# Patient Record
Sex: Female | Born: 1955 | Hispanic: No | Marital: Married
Health system: Southern US, Community
[De-identification: ages and names within clinical notes are randomized; demographics above are authoritative.]

---

## 2015-06-19 ENCOUNTER — Ambulatory Visit
Admission: RE | Admit: 2015-06-19 | Discharge: 2015-06-19 | Disposition: A | Discharge status: Home / Self Care | Payer: PRIVATE HEALTH INSURANCE | Source: Ambulatory Visit | Attending: Infectious Disease | Admitting: Infectious Disease

## 2015-06-19 ENCOUNTER — Other Ambulatory Visit: Payer: Self-pay | Admitting: Infectious Disease

## 2015-06-19 DIAGNOSIS — Z111 Encounter for screening for respiratory tuberculosis: Secondary | ICD-10-CM

## 2015-07-16 ENCOUNTER — Encounter (HOSPITAL_COMMUNITY): Payer: Self-pay | Admitting: *Deleted

## 2015-07-16 ENCOUNTER — Emergency Department (INDEPENDENT_AMBULATORY_CARE_PROVIDER_SITE_OTHER): Payer: Medicaid Other

## 2015-07-16 ENCOUNTER — Emergency Department (INDEPENDENT_AMBULATORY_CARE_PROVIDER_SITE_OTHER)
Admission: EM | Admit: 2015-07-16 | Discharge: 2015-07-16 | Disposition: A | Discharge status: Home / Self Care | Payer: Medicaid Other | Source: Home / Self Care | Attending: Family Medicine | Admitting: Family Medicine

## 2015-07-16 DIAGNOSIS — S339XXA Sprain of unspecified parts of lumbar spine and pelvis, initial encounter: Secondary | ICD-10-CM

## 2015-07-16 DIAGNOSIS — S335XXA Sprain of ligaments of lumbar spine, initial encounter: Secondary | ICD-10-CM

## 2015-07-16 MED ORDER — MELOXICAM 7.5 MG PO TABS: 7.5000 mg | ORAL_TABLET | Freq: Two times a day (BID) | ORAL | Status: DC

## 2015-07-16 MED ORDER — METHOCARBAMOL 500 MG PO TABS: 500.0000 mg | ORAL_TABLET | Freq: Two times a day (BID) | ORAL | Status: AC

## 2015-07-16 NOTE — ED Notes (Signed)
Pt  Reports  Low  Back  Pain      Pt  Reports   She has  Had   Pain  Radiating  Down  r   Leg           She   Has  Had  The  Pain      Prior   To  Arriving  [place]  [place]ths  Ago         She  Has  Sample  Packs  Of trammadol  With  Her     She ambulated  To  Room        She  Denied  Any  Recent  specefic  Injury             [employer]  Utilized    For  Translation

## 2015-07-16 NOTE — ED Provider Notes (Signed)
CSN: [id number]     Arrival date & time 07/16/15  1300 History   First MD Initiated Contact with Patient 07/16/15 1310     Chief Complaint  Patient presents with  . Back Pain   (Consider location/radiation/quality/duration/timing/severity/associated sxs/prior Treatment) Patient is a 59 y.o. female presenting with back pain. The history is provided by the patient and the spouse. The history is limited by a language barrier. A language interpreter was used.  Back Pain Location:  Lumbar spine Quality:  Shooting Radiates to:  R posterior upper leg Pain severity:  Moderate Onset quality:  Gradual Duration:  1 month Timing:  Constant Progression:  Unchanged Chronicity:  New Ineffective treatments:  NSAIDs (seen in [place] and given med which hasn't helped.) Associated symptoms: leg pain   Associated symptoms: no abdominal pain, no dysuria, no fever, no numbness and no pelvic pain     History reviewed. No pertinent past medical history. History reviewed. No pertinent past surgical history. History reviewed. No pertinent family history. History  Substance Use Topics  . Smoking status: Not on file  . Smokeless tobacco: Not on file  . Alcohol Use: No   OB History    No data available     Review of Systems  Constitutional: Negative.  Negative for fever.  Gastrointestinal: Negative.  Negative for abdominal pain.  Genitourinary: Negative for dysuria, hematuria and pelvic pain.  Musculoskeletal: Positive for back pain. Negative for gait problem.  Skin: Negative.   Neurological: Negative for numbness.    Allergies  Review of patient's allergies indicates no known allergies.  Home Medications   Prior to Admission medications   Medication Sig Start Date End Date Taking? Authorizing Provider  TRAMADOL HCL ER PO Take by mouth.   Yes Historical Provider, MD  meloxicam (MOBIC) 7.5 MG tablet Take 1 tablet (7.5 mg total) by mouth 2 (two) times daily after a meal. 07/16/15   Linna Hoff,  MD  methocarbamol (ROBAXIN) 500 MG tablet Take 1 tablet (500 mg total) by mouth 2 (two) times daily. As muscle relaxer 07/16/15   Linna Hoff, MD   BP 137/84 mmHg  Pulse 76  Temp(Src) 98.1 F (36.7 C) (Oral)  Resp 12  SpO2 96%  LMP  (Exact Date) Physical Exam  Constitutional: She is oriented to person, place, and time. She appears well-developed and well-nourished. No distress.  Abdominal: Soft. Bowel sounds are normal.  Musculoskeletal: She exhibits tenderness.       Lumbar back: She exhibits tenderness, pain and spasm. She exhibits normal range of motion, no bony tenderness, no swelling and normal pulse.       Back:  Neurological: She is alert and oriented to person, place, and time.  Skin: Skin is warm and dry.  Nursing note and vitals reviewed.   ED Course  Procedures (including critical care time) Labs Review Labs Reviewed - No data to display  Imaging Review Dg Lumbar Spine Complete  07/16/2015   CLINICAL DATA:  Right lumbar pain  EXAM: LUMBAR SPINE - COMPLETE 4+ VIEW  COMPARISON:  None.  FINDINGS: There is no evidence of lumbar spine fracture. Alignment is normal. Intervertebral disc spaces are maintained.  IMPRESSION: Negative.   Electronically Signed   By: Marlan Palau M.D.   On: 07/16/2015 13:59    X-rays reviewed and report per radiologist.  MDM   1. Low back sprain, initial encounter        Linna Hoff, MD 07/16/15 [phone]

## 2015-08-25 ENCOUNTER — Ambulatory Visit: Payer: Self-pay | Admitting: Family Medicine

## 2015-09-01 ENCOUNTER — Ambulatory Visit: Payer: Self-pay | Admitting: Family Medicine

## 2015-09-11 ENCOUNTER — Ambulatory Visit: Payer: Self-pay | Admitting: Family Medicine

## 2015-09-25 ENCOUNTER — Ambulatory Visit: Payer: Self-pay | Admitting: Family Medicine

## 2015-09-28 DIAGNOSIS — Z7689 Persons encountering health services in other specified circumstances: Secondary | ICD-10-CM

## 2015-09-28 NOTE — Congregational Nurse Program (Unsigned)
09-23-15 Office visit to reschedule referral to [facility] PCP. B/p 128/79. Pulse 75. Unable to check B/S today per client's request.  Appointment at [facility] on September 29, 2015 at 3:00 pm. [address] [place], [place]  [phone]) Requires interpreter; speaks Arabic.

## 2015-09-29 ENCOUNTER — Ambulatory Visit (INDEPENDENT_AMBULATORY_CARE_PROVIDER_SITE_OTHER): Payer: Medicaid Other | Admitting: Family Medicine

## 2015-09-29 ENCOUNTER — Encounter: Payer: Self-pay | Admitting: Family Medicine

## 2015-09-29 VITALS — BP 137/70 | HR 79 | Temp 97.7°F | Resp 16 | Ht 60.0 in | Wt 161.0 lb

## 2015-09-29 DIAGNOSIS — Z0000 Encounter for general adult medical examination without abnormal findings: Secondary | ICD-10-CM

## 2015-09-29 DIAGNOSIS — M542 Cervicalgia: Secondary | ICD-10-CM | POA: Diagnosis not present

## 2015-09-29 DIAGNOSIS — Z7689 Persons encountering health services in other specified circumstances: Secondary | ICD-10-CM

## 2015-09-29 DIAGNOSIS — Z7189 Other specified counseling: Secondary | ICD-10-CM

## 2015-09-29 MED ORDER — MELOXICAM 7.5 MG PO TABS: 7.5000 mg | ORAL_TABLET | Freq: Every day | ORAL | Status: AC

## 2015-09-29 NOTE — Progress Notes (Signed)
Patient ID: Kaitlyn Burton, female   DOB: 07/10/1956, 59 y.o.   MRN: [id number]   Kaitlyn Burton, is a 59 y.o. female  [id number]  [id number]  DOB - 03/02/1956  CC:  Chief Complaint  Patient presents with  . Establish Care    Patient states pressure all over body. Sore muscle in back of neck         HPI: Kaitlyn Burton is a 59 y.o. female here to establish care. She denies chronic illness. Her only recent medications have been for neck and back pain. Meloxicam, Robaxin and Tramadol were prescribed in late August for same. She reports having a PAP and mammogram through the [facility]. She brings her immunization records and everything is up to date. She reports having a sense of increased pressure in her body.  I am not sure what that means. Her only other complaint is of the ongoing neck pain with radiation into the back. This is aggravated by work activities and relieved with rest.   No Known Allergies No past medical history on file. Current Outpatient Prescriptions on File Prior to Visit  Medication Sig Dispense Refill  . methocarbamol (ROBAXIN) 500 MG tablet Take 1 tablet (500 mg total) by mouth 2 (two) times daily. As muscle relaxer (Patient not taking: Reported on 09/29/2015) 30 tablet 0  . TRAMADOL HCL ER PO Take by mouth.     No current facility-administered medications on file prior to visit.   No family history on file. Social History   Social History  . Marital Status: Married    Spouse Name: N/A  . Number of Children: N/A  . Years of Education: N/A   Occupational History  . Not on file.   Social History Main Topics  . Smoking status: Not on file  . Smokeless tobacco: Not on file  . Alcohol Use: No  . Drug Use: Not on file  . Sexual Activity: Not on file   Other Topics Concern  . Not on file   Social History Narrative  . No narrative on file    Review of Systems: Constitutional: Negative for fever, chills, appetite change, weight loss,  Fatigue. Skin:  Negative for rashes or lesions of concern. HENT: Negative for ear pain, ear discharge.nose bleeds Eyes: Negative for pain, discharge, redness, itching and visual disturbance. Needs glasses Neck: Negative for pain, stiffness Respiratory: Negative for cough, shortness of breath,   Cardiovascular: Negative for chest pain, palpitations and leg swelling. Gastrointestinal: Negative for abdominal pain, nausea, vomiting, diarrhea, constipations Genitourinary: Negative for dysuria, urgency, frequency, hematuria,  Musculoskeletal: Negative for joint pain, joint  swelling, and gait problem.Negative for weakness. Positive for neck and back pain. Occassional right arm pain. Neurological: Negative for dizziness, tremors, seizures, syncope,   light-headedness, numbness and headaches.  Hematological: Negative for easy bruising or bleeding Psychiatric/Behavioral: Negative for depression, anxiety, decreased concentration, confusion   Objective:   Filed Vitals:   09/29/15 1455  BP: 137/70  Pulse: 79  Temp: 97.7 F (36.5 C)  Resp: 16    Physical Exam: Constitutional: Patient appears well-developed and well-nourished. No distress. HENT: Normocephalic, atraumatic, External right and left ear normal. Oropharynx is clear and moist.  Eyes: Conjunctivae and EOM are normal. PERRLA, no scleral icterus. Neck: Normal ROM. Neck supple. No lymphadenopathy, No thyromegaly. CVS: RRR, S1/S2 +, no murmurs, no gallops, no rubs Pulmonary: Effort and breath sounds normal, no stridor, rhonchi, wheezes, rales.  Abdominal: Soft. Normoactive BS,, no distension, tenderness, rebound or guarding.  Musculoskeletal: Normal  range of motion. No edema. There is mild tenderness over the left lateral neck.  Neuro: Alert.Normal muscle tone coordination. Non-focal Skin: Skin is warm and dry. No rash noted. Not diaphoretic. No erythema. No pallor. Psychiatric: Normal mood and affect. Behavior, judgment, thought content normal.  No  results found for: WBC, HGB, HCT, MCV, PLT No results found for: CREATININE, BUN, NA, K, CL, CO2  No results found for: HGBA1C Lipid Panel  No results found for: CHOL, TRIG, HDL, CHOLHDL, VLDL, LDLCALC     Assessment and plan:   1. Health care maintenance  - COMPLETE METABOLIC PANEL WITH GFR - CBC with Differential - Lipid panel - TSH - Vitamin D 1,25 dihydroxy - HIV antibody (with reflex)  2. Neck and upper back pain -meloxicam 7.5 mg, one po q day with food -Advised good sleeping position, heat and ice  3. Visit to Establish Care -I have reviewed information presented by the patient with the help of an interpreter and have reviewed immunization records from the health department.  Return in about 6 months (around 03/29/2016).  The patient was given clear instructions to go to ER or return to medical center if symptoms don't improve, worsen or new problems develop. The patient verbalized understanding.    Kaitlyn HooverLinda C Burton Yearick FNP  09/29/2015, 4:06 PM

## 2015-09-29 NOTE — Patient Instructions (Addendum)
I am represcribing a medication for your neck pain (meloxicam). Take in once a day with food. Be careful to keep your neck straight at night when you sleep.  We need to schedule you for a colonoscopy to screen for colon cancer in the near future.someone will will call you about this.  We will let you know if there is anything in your bloodwork that needs attention.

## 2015-09-30 LAB — LIPID PANEL
Cholesterol: 200 mg/dL (ref 125–200)
HDL: 40 mg/dL — AB (ref >=46)
LDL Cholesterol: 118 mg/dL (ref <130)
Total CHOL/HDL Ratio: 5 Ratio (ref <=5.0)
Triglycerides: 212 mg/dL — ABNORMAL HIGH (ref <150)
VLDL: 42 mg/dL — AB (ref <30)

## 2015-09-30 LAB — CBC WITH DIFFERENTIAL/PLATELET
BASOS ABS: 0 10*3/uL (ref 0.0–0.1)
Basophils Relative: 0 % (ref 0–1)
Eosinophils Absolute: 0.2 10*3/uL (ref 0.0–0.7)
Eosinophils Relative: 2 % (ref 0–5)
HCT: 39.5 % (ref 36.0–46.0)
HEMOGLOBIN: 13.1 g/dL (ref 12.0–15.0)
LYMPHS ABS: 2.8 10*3/uL (ref 0.7–4.0)
Lymphocytes Relative: 36 % (ref 12–46)
MCH: 27.2 pg (ref 26.0–34.0)
MCHC: 33.2 g/dL (ref 30.0–36.0)
MCV: 82 fL (ref 78.0–100.0)
MONOS PCT: 5 % (ref 3–12)
MPV: 11 fL (ref 8.6–12.4)
Monocytes Absolute: 0.4 10*3/uL (ref 0.1–1.0)
NEUTROS PCT: 57 % (ref 43–77)
Neutro Abs: 4.4 10*3/uL (ref 1.7–7.7)
Platelets: 246 10*3/uL (ref 150–400)
RBC: 4.82 MIL/uL (ref 3.87–5.11)
RDW: 13.8 % (ref 11.5–15.5)
WBC: 7.8 10*3/uL (ref 4.0–10.5)

## 2015-09-30 LAB — COMPLETE METABOLIC PANEL WITH GFR
ALT: 11 U/L (ref 6–29)
AST: 15 U/L (ref 10–35)
Albumin: 4.2 g/dL (ref 3.6–5.1)
Alkaline Phosphatase: 72 U/L (ref 33–130)
BUN: 17 mg/dL (ref 7–25)
CO2: 23 mmol/L (ref 20–31)
Calcium: 8.8 mg/dL (ref 8.6–10.4)
Chloride: 106 mmol/L (ref 98–110)
Creat: 0.65 mg/dL (ref 0.50–0.99)
GFR, Est African American: >89 mL/min (ref >=60)
GFR, Est Non African American: >89 mL/min (ref >=60)
GLUCOSE: 111 mg/dL — AB (ref 65–99)
POTASSIUM: 4.4 mmol/L (ref 3.5–5.3)
SODIUM: 138 mmol/L (ref 135–146)
Total Bilirubin: 0.4 mg/dL (ref 0.2–1.2)
Total Protein: 7.2 g/dL (ref 6.1–8.1)

## 2015-09-30 LAB — HIV ANTIBODY (ROUTINE TESTING W REFLEX): HIV: NONREACTIVE

## 2015-09-30 LAB — TSH: TSH: 0.999 u[IU]/mL (ref 0.350–4.500)

## 2015-10-07 LAB — VITAMIN D 1,25 DIHYDROXY

## 2015-10-09 ENCOUNTER — Telehealth: Payer: Self-pay

## 2015-10-09 ENCOUNTER — Other Ambulatory Visit: Payer: Self-pay | Admitting: Family Medicine

## 2015-10-09 MED ORDER — SIMVASTATIN 20 MG PO TABS: 20.0000 mg | ORAL_TABLET | Freq: Every day | ORAL | Status: DC

## 2015-10-09 NOTE — Telephone Encounter (Signed)
-----   Message from Henrietta HooverLinda C Bernhardt, NP sent at 10/09/2015  7:50 AM EDT ----- HIV non reactive. TSH normal.Glucose 111 but not fasting.Triclycerides sl elevated but not fasting. Bad cholesterol and little elevated. Recommend medication. Will send in.

## 2015-10-09 NOTE — Telephone Encounter (Signed)
Called using [employer], Interpreter ID # [id number]. Called, Patient not available at this time, left message with son for patient to return call. Thanks!

## 2015-10-13 NOTE — Telephone Encounter (Signed)
Letter has been mailed to patient to contact us regarding labs. Thanks!

## 2015-10-21 ENCOUNTER — Telehealth: Payer: Self-pay | Admitting: *Deleted

## 2015-10-21 NOTE — Telephone Encounter (Signed)
Medical Assistant spoke with interpreter for patient. Patient was informed of her HIV being negative, patients Glucose was 111 non fasting. Patients Triglycerides were elevated.  Patient informed of Simvastatin being ordered and [facility] to the [facility] on the [address]. Patient had no further questions.

## 2016-02-28 LAB — POCT LIPID PANEL
HDL: 44
LDL: 111
TC: 194
TRG: 194

## 2016-02-28 LAB — GLUCOSE, POCT (MANUAL RESULT ENTRY): POC Glucose: 92 mg/dl (ref 70–99)

## 2016-03-29 ENCOUNTER — Ambulatory Visit: Payer: Medicaid Other | Admitting: Family Medicine

## 2016-05-03 ENCOUNTER — Ambulatory Visit: Payer: Medicaid Other | Attending: Internal Medicine

## 2016-06-22 ENCOUNTER — Ambulatory Visit (HOSPITAL_COMMUNITY)
Admission: EM | Admit: 2016-06-22 | Discharge: 2016-06-22 | Disposition: A | Discharge status: Home / Self Care | Payer: Medicaid Other | Attending: Family Medicine | Admitting: Family Medicine

## 2016-06-22 ENCOUNTER — Encounter (HOSPITAL_COMMUNITY): Payer: Self-pay | Admitting: Emergency Medicine

## 2016-06-22 ENCOUNTER — Ambulatory Visit (INDEPENDENT_AMBULATORY_CARE_PROVIDER_SITE_OTHER): Payer: Self-pay

## 2016-06-22 DIAGNOSIS — M25512 Pain in left shoulder: Secondary | ICD-10-CM

## 2016-06-22 DIAGNOSIS — R1012 Left upper quadrant pain: Secondary | ICD-10-CM

## 2016-06-22 DIAGNOSIS — R059 Cough, unspecified: Secondary | ICD-10-CM

## 2016-06-22 DIAGNOSIS — R05 Cough: Secondary | ICD-10-CM

## 2016-06-22 NOTE — Congregational Nurse Program (Signed)
Congregational Nurse Program Note  Date of Encounter: 06/22/2016  Past Medical History: No past medical history on file.  Encounter Details:     CNP Questionnaire - 06/22/16 1515    Patient Demographics   Is this a new or existing patient? Existing   Patient is considered a/an Refugee   Race Asian   Patient Assistance   Location of Patient Assistance Not Applicable   Patient's financial/insurance status Medicaid   Uninsured Patient Yes   Interventions Referred to ED/Urgent Care   Patient referred to apply for the following financial assistance Not Applicable   Food insecurities addressed Not Applicable   Transportation assistance No   Assistance securing medications No   Educational health offerings Navigating the healthcare system   Encounter Details   Primary purpose of visit Acute Illness/Condition Visit   Was an Emergency Department visit averted? Yes   Does patient have a medical provider? No   Patient referred to Urgent Care   Was a mental health screening completed? (GAINS tool) No   Does patient have dental issues? No   Does patient have vision issues? Yes   Was a vision referral made? No   Does your patient have an abnormal blood pressure today? No   Since previous encounter, have you referred patient for abnormal blood pressure that resulted in a new diagnosis or medication change? No   Does your patient have an abnormal blood glucose today? No   Since previous encounter, have you referred patient for abnormal blood glucose that resulted in a new diagnosis or medication change? No   Was there a life-saving intervention made? No     Office vijsit for this Arabic speaking lady accompanied by son who interpreted information. Reports falling on apartment steps about one week ago twisting left ankle and foot. Sustained some bruising that appears to be subsiding with bluish yellowish color of skin. Applied home remedy for soreness. Major concern from son is recent non  productive cough, pain in left shoulder and chest area extending into upper back. Denies history of TB or pneumonia. Related pain to previous joint and arthritic type pain.  Perspiring with cool,  pale clammy skin today. Blood pressure lower than usual.  [facility] with son and spouse. Return to office for assistance in locating PCP appointment. Ferol LuzMarietta Deb Loudin, RN/CN

## 2016-06-22 NOTE — Discharge Instructions (Signed)
°íÙåÑ áÏíß ßÑÇí íÓíæä Úáì ÇáÌÇäÈ ÇáÃíÓÑ ãä ÇáÑÆÉ. åÐÇ åæ áíÓíæä ÇáÊí ÓæÝ ÊÍÊÇÌ Åáì ãÒíÏ ãä ÊÞííãåÇ ãÚ ÇÎÊÈÇÑ ÎÇÕ ÏÚÇ ßÊ ÇáãÓÍ ÇáÖæÆí. °ÓæÝ ÊÍÊÇÌ Åáì ÑÄíÉ ØÈíÈ ÇÓãå. ÇáÏßÊæÑ. ãßíÇÛ æÇäå ÓæÝ íÎÕÕ áß ááÍÕæá Úáì åÐÇ ÇáÇÎÊÈÇÑ. °  yuzhir ladayk kuray yasyun ealaa aljanib al'aysar min alriyati. hadha hu lisiuwn alty sawf tahtaj 'iilaa mazid min taqyimiha mae aikhtibar khasin daea kt [place] aldawyiy. sawf tahtaj 'iilaa ruyat tabib aismih. alduktur. makyagh wa'anah sawf yukhasas lak lilhusul ealaa hadha alaikhtibar.

## 2016-06-22 NOTE — ED Provider Notes (Signed)
CSN: [id number]     Arrival date & time 06/22/16  1136 History   First MD Initiated Contact with Patient 06/22/16 1202     Chief Complaint  Patient presents with  . Shoulder Pain  . Cough   (Consider location/radiation/quality/duration/timing/severity/associated sxs/prior Treatment) HPI History obtained from husband through phone interpreter (Arabic)  Pt presents with the cc of:  Left shoulder pain Duration of symptoms: 7 months Treatment prior to arrival: Tylenol Context: She has had off-and-on pain in the left shoulder for about several months now. Has never had it evaluated. Pain is worsened over the past couple of days. No known injury. Other symptoms include: Coughing Pain score: 3 FAMILY HISTORY: None  Patient does have a lot of secondhand smoke exposure. She does not smoke.  History reviewed. No pertinent past medical history. History reviewed. No pertinent past surgical history. History reviewed. No pertinent family history. Social History  Substance Use Topics  . Smoking status: Never Smoker   . Smokeless tobacco: None  . Alcohol Use: No   OB History    No data available     Review of Systems  Denies: HEADACHE, NAUSEA, ABDOMINAL PAIN, CHEST PAIN, CONGESTION, DYSURIA, SHORTNESS OF BREATH  Allergies  Review of patient's allergies indicates no known allergies.  Home Medications   Prior to Admission medications   Medication Sig Start Date End Date Taking? Authorizing Provider  meloxicam (MOBIC) 7.5 MG tablet Take 1 tablet (7.5 mg total) by mouth daily. 09/29/15   Henrietta HooverLinda C Bernhardt, NP  methocarbamol (ROBAXIN) 500 MG tablet Take 1 tablet (500 mg total) by mouth 2 (two) times daily. As muscle relaxer Patient not taking: Reported on 09/29/2015 07/16/15   Linna HoffJames D Kindl, MD  simvastatin (ZOCOR) 20 MG tablet Take 1 tablet (20 mg total) by mouth at bedtime. 10/09/15   Henrietta HooverLinda C Bernhardt, NP  TRAMADOL HCL ER PO Take by mouth.    Historical Provider, MD   Meds Ordered and  Administered this Visit  Medications - No data to display  BP 150/82 mmHg  Pulse 79  Temp(Src) 98.1 F (36.7 C) (Oral)  Resp 20  SpO2 96%  LMP  (Exact Date) No data found.   Physical Exam NURSES NOTES AND VITAL SIGNS REVIEWED. CONSTITUTIONAL: Well developed, well nourished, no acute distress HEENT: normocephalic, atraumatic EYES: Conjunctiva normal NECK:normal ROM, supple, no adenopathy PULMONARY:No respiratory distress, normal effort ABDOMINAL: Soft, ND, NT BS+, No CVAT MUSCULOSKELETAL: Normal ROM of all extremities,  SKIN: warm and dry without rash PSYCHIATRIC: Mood and affect, behavior are normal  ED Course  Procedures (including critical care time)  Labs Review Labs Reviewed - No data to display  Imaging Review Dg Chest 2 View  06/22/2016  CLINICAL DATA:  60 year old female with a history of left shoulder pain EXAM: CHEST  2 VIEW COMPARISON:  None. FINDINGS: Cardiomediastinal silhouette within normal limits. Soft tissue density projecting superior to the aortic arch, along the medial pleural parenchymal interface at the mediastinal border. Pleural parenchymal thickening at the apex of the left lung. No confluent airspace disease.  No pneumothorax. Stigmata of emphysema, with increased retrosternal airspace, flattened hemidiaphragms, increased AP diameter, and hyperinflation on the AP view. IMPRESSION: Soft tissue opacity along the medial left upper lung, likely posterior on the lateral view. Further evaluation with contrast-enhanced chest CT recommended to evaluate for malignancy. These results will be called to the ordering clinician or representative by the Radiologist Assistant, and communication documented in the PACS or zVision Dashboard. Signed, Yvone NeuJaime S. Loreta AveWagner,  DO [facility] Radiology Electronically Signed   By: Gilmer Mor D.O.   On: 06/22/2016 13:01    Discussed with patient and on duty FP resident who has agreed to  have pt follow up at the [facility].  Visual Acuity Review  Right Eye Distance:   Left Eye Distance:   Bilateral Distance:    Right Eye Near:   Left Eye Near:    Bilateral Near:         MDM   1. Left shoulder pain     Patient is reassured that there are no issues that require transfer to higher level of care at this time or additional tests. Patient is advised to continue home symptomatic treatment. Patient is advised that if there are new or worsening symptoms to attend the emergency department, contact primary care provider, or return to UC. Instructions of care provided discharged home in stable condition.    THIS NOTE WAS GENERATED USING A VOICE RECOGNITION SOFTWARE PROGRAM. ALL REASONABLE EFFORTS  WERE MADE TO PROOFREAD THIS DOCUMENT FOR ACCURACY.  I have verbally reviewed the discharge instructions with the patient. A printed AVS was given to the patient.  All questions were answered prior to discharge.      Tharon Aquas, PA 06/22/16 1414

## 2016-06-22 NOTE — ED Notes (Signed)
[facility] with a complaint of left shoulder pain that radiates down her back x 7 years. The patient stated that she has a "mass" on her back where the pain originates.

## 2016-07-12 ENCOUNTER — Ambulatory Visit: Payer: Medicaid Other | Attending: Internal Medicine

## 2016-07-23 ENCOUNTER — Ambulatory Visit (INDEPENDENT_AMBULATORY_CARE_PROVIDER_SITE_OTHER): Payer: No Typology Code available for payment source | Admitting: Family Medicine

## 2016-07-23 ENCOUNTER — Encounter: Payer: Self-pay | Admitting: Family Medicine

## 2016-07-23 VITALS — BP 129/75 | HR 78 | Temp 98.3°F | Resp 16 | Ht 60.0 in | Wt 158.0 lb

## 2016-07-23 DIAGNOSIS — Z1239 Encounter for other screening for malignant neoplasm of breast: Secondary | ICD-10-CM

## 2016-07-23 DIAGNOSIS — I10 Essential (primary) hypertension: Secondary | ICD-10-CM

## 2016-07-23 DIAGNOSIS — Z1159 Encounter for screening for other viral diseases: Secondary | ICD-10-CM

## 2016-07-23 DIAGNOSIS — M25512 Pain in left shoulder: Secondary | ICD-10-CM

## 2016-07-23 DIAGNOSIS — E7800 Pure hypercholesterolemia, unspecified: Secondary | ICD-10-CM | POA: Insufficient documentation

## 2016-07-23 DIAGNOSIS — L989 Disorder of the skin and subcutaneous tissue, unspecified: Secondary | ICD-10-CM

## 2016-07-23 LAB — CBC WITH DIFFERENTIAL/PLATELET
BASOS PCT: 0 %
Basophils Absolute: 0 cells/uL (ref 0–200)
Eosinophils Absolute: 136 cells/uL (ref 15–500)
Eosinophils Relative: 2 %
HEMATOCRIT: 39.6 % (ref 35.0–45.0)
Hemoglobin: 13.1 g/dL (ref 11.7–15.5)
LYMPHS ABS: 2584 {cells}/uL (ref 850–3900)
LYMPHS PCT: 38 %
MCH: 26.8 pg — ABNORMAL LOW (ref 27.0–33.0)
MCHC: 33.1 g/dL (ref 32.0–36.0)
MCV: 81.1 fL (ref 80.0–100.0)
MONO ABS: 408 {cells}/uL (ref 200–950)
MPV: 10.9 fL (ref 7.5–12.5)
Monocytes Relative: 6 %
Neutro Abs: 3672 cells/uL (ref 1500–7800)
Neutrophils Relative %: 54 %
PLATELETS: 276 10*3/uL (ref 140–400)
RBC: 4.88 MIL/uL (ref 3.80–5.10)
RDW: 14 % (ref 11.0–15.0)
WBC: 6.8 10*3/uL (ref 3.8–10.8)

## 2016-07-23 LAB — COMPLETE METABOLIC PANEL WITH GFR
ALT: 13 U/L (ref 6–29)
AST: 12 U/L (ref 10–35)
Albumin: 4.1 g/dL (ref 3.6–5.1)
Alkaline Phosphatase: 85 U/L (ref 33–130)
BUN: 13 mg/dL (ref 7–25)
CHLORIDE: 107 mmol/L (ref 98–110)
CO2: 24 mmol/L (ref 20–31)
CREATININE: 0.54 mg/dL (ref 0.50–0.99)
Calcium: 9.3 mg/dL (ref 8.6–10.4)
GFR, Est African American: >89 mL/min (ref >=60)
GFR, Est Non African American: >89 mL/min (ref >=60)
Glucose, Bld: 107 mg/dL — ABNORMAL HIGH (ref 65–99)
Potassium: 4.2 mmol/L (ref 3.5–5.3)
SODIUM: 139 mmol/L (ref 135–146)
Total Bilirubin: 0.4 mg/dL (ref 0.2–1.2)
Total Protein: 7 g/dL (ref 6.1–8.1)

## 2016-07-23 LAB — LIPID PANEL
CHOLESTEROL: 189 mg/dL (ref 125–200)
HDL: 49 mg/dL (ref >=46)
LDL CALC: 99 mg/dL (ref <130)
TRIGLYCERIDES: 203 mg/dL — AB (ref <150)
Total CHOL/HDL Ratio: 3.9 Ratio (ref <=5.0)
VLDL: 41 mg/dL — ABNORMAL HIGH (ref <30)

## 2016-07-23 NOTE — Progress Notes (Signed)
Kaitlyn Burton, is a 60 y.o. female  ZYS:[id number]  [id number]  DOB - 06-15-1956  CC:  Chief Complaint  Patient presents with  . Shoulder Pain    left shoulder.        HPI: Kaitlyn Burton is a 60 y.o. female presents to follow-up on ED visit forleft shoulder pain. At that visit an x-ray chest x-ray showed a lesion and a contrasted enhanced CT has been ordered. She reports that her shoulder pain is unchanged since that visit.She is currently only using OTC analgesics for discomfort.   No Known Allergies History reviewed. No pertinent past medical history. Current Outpatient Prescriptions on File Prior to Visit  Medication Sig Dispense Refill  . meloxicam (MOBIC) 7.5 MG tablet Take 1 tablet (7.5 mg total) by mouth daily. (Patient not taking: Reported on 07/23/2016) 30 tablet 1  . methocarbamol (ROBAXIN) 500 MG tablet Take 1 tablet (500 mg total) by mouth 2 (two) times daily. As muscle relaxer (Patient not taking: Reported on 07/23/2016) 30 tablet 0  . simvastatin (ZOCOR) 20 MG tablet Take 1 tablet (20 mg total) by mouth at bedtime. (Patient not taking: Reported on 07/23/2016) 90 tablet 3  . TRAMADOL HCL ER PO Take by mouth.     No current facility-administered medications on file prior to visit.    History reviewed. No pertinent family history. Social History   Social History  . Marital status: Married    Spouse name: N/A  . Number of children: N/A  . Years of education: N/A   Occupational History  . Not on file.   Social History Main Topics  . Smoking status: Never Smoker  . Smokeless tobacco: Not on file  . Alcohol use No  . Drug use: No  . Sexual activity: Not on file   Other Topics Concern  . Not on file   Social History Narrative  . No narrative on file    Review of Systems: Constitutional: Negative for fever, chills, appetite change, weight loss,  Fatigue. Skin: Negative for rashes or lesions of concern. HENT: Negative for ear pain, ear discharge.nose  bleeds Eyes: Negative for pain, discharge, redness, itching and visual disturbance. Neck: Negative for pain, stiffness Respiratory: Negative for cough, shortness of breath,   Cardiovascular: Negative for chest pain, palpitations and leg swelling. Gastrointestinal: Negative for abdominal pain, nausea, vomiting, diarrhea, constipations Genitourinary: Negative for dysuria, urgency, frequency, hematuria,  Musculoskeletal: Negative for back pain, joint pain, joint  swelling, and gait problem.Negative for weakness. Neurological: Negative for dizziness, tremors, seizures, syncope,   light-headedness, numbness and headaches.  Hematological: Negative for easy bruising or bleeding Psychiatric/Behavioral: Negative for depression, anxiety, decreased concentration, confusion   Objective:   Vitals:   07/23/16 1025  BP: 129/75  Pulse: 78  Resp: 16  Temp: 98.3 F (36.8 C)    Physical Exam: Constitutional: Patient appears well-developed and well-nourished. No distress. HENT: Normocephalic, atraumatic, External right and left ear normal. Oropharynx is clear and moist.  Eyes: Conjunctivae and EOM are normal. PERRLA, no scleral icterus. Neck: Normal ROM. Neck supple. No lymphadenopathy, No thyromegaly. CVS: RRR, S1/S2 +, no murmurs, no gallops, no rubs Pulmonary: Effort and breath sounds normal, no stridor, rhonchi, wheezes, rales.  Abdominal: Soft. Normoactive BS,, no distension, tenderness, rebound or guarding.  Musculoskeletal: Normal range of motion. No edema and no tenderness.  Neuro: Alert.Normal muscle tone coordination. Non-focal Skin: Skin is warm and dry. No rash noted. Not diaphoretic. No erythema. No pallor. Psychiatric: Normal mood and affect. Behavior, judgment,  thought content normal.  Lab Results  Component Value Date   WBC 7.8 09/29/2015   HGB 13.1 09/29/2015   HCT 39.5 09/29/2015   MCV 82.0 09/29/2015   PLT 246 09/29/2015   Lab Results  Component Value Date   CREATININE  0.65 09/29/2015   BUN 17 09/29/2015   NA 138 09/29/2015   K 4.4 09/29/2015   CL 106 09/29/2015   CO2 23 09/29/2015    No results found for: HGBA1C Lipid Panel     Component Value Date/Time   CHOL 200 09/29/2015 1555   TRIG 212 (H) 09/29/2015 1555   HDL 40 (L) 09/29/2015 1555   CHOLHDL 5.0 09/29/2015 1555   VLDL 42 (H) 09/29/2015 1555   LDLCALC 118 09/29/2015 1555       Assessment and plan:   1. Pure hypercholesterolemia -continue current medication  2. Screening for breast cancer  - MM DIGITAL SCREENING BILATERAL; Future  3. Chest lesion  - CT Chest High Resolution; Future  4. Essential hypertension  - COMPLETE METABOLIC PANEL WITH GFR - CBC w/Diff - Lipid panel  5. Need for hepatitis C screening test  - Hepatitis C Antibody   No Follow-up on file.  The patient was given clear instructions to go to ER or return to medical center if symptoms don't improve, worsen or new problems develop. The patient verbalized understanding.    Henrietta HooverLinda C Kanon Novosel FNP  07/23/2016, 1:14 PM

## 2016-07-24 LAB — HEPATITIS C ANTIBODY: HCV Ab: NEGATIVE

## 2016-08-11 DIAGNOSIS — R1012 Left upper quadrant pain: Secondary | ICD-10-CM

## 2016-08-18 NOTE — Congregational Nurse Program (Signed)
Congregational Nurse Program Note  Date of Encounter: 08/11/2016  Past Medical History: No past medical history on file.  Encounter Details:      Amb Nursing Assessment - 07/23/16 1047      Pain Assessment   Pain Assessment No/denies pain     Nutrition Screen   Diabetes No     Functional Status   Activities of Daily Living Independent   Ambulation Independent   Medication Administration Independent   Home Management Independent     Abuse/Neglect Assessment   Do you feel unsafe in your current relationship? No   Do you feel physically threatened by others? No   Anyone hurting you at home, work, or school? No   Unable to ask? No     Patient Literacy   How often do you need to have someone help you when you read instructions, pamphlets, or other written materials from your doctor or pharmacy? 5 - Always     Language Assistant   Interpreter Needed? Yes   Sales execut[employer]enterpreter [employer]   Interpreter ID [id number]     Requested assistance in rescheduling appointment for CT Scan at [facility], [address], [place].  Previous appointment on 08/13/16 at 9:45am. Rescheduled CT Scan for 08/20/16  at 9:45 am.Transportation arranged. Ferol LuzMarietta Melanye Hiraldo, RN/[phone]

## 2016-09-01 DIAGNOSIS — R05 Cough: Secondary | ICD-10-CM

## 2016-09-01 DIAGNOSIS — R059 Cough, unspecified: Secondary | ICD-10-CM

## 2016-09-01 DIAGNOSIS — R1012 Left upper quadrant pain: Secondary | ICD-10-CM

## 2016-09-01 NOTE — Congregational Nurse Program (Signed)
Congregational Nurse Program Note  Date of Encounter: 09/01/2016  Past Medical History: No past medical history on file.  Encounter Details:     CNP Questionnaire - 09/01/16 1030      Patient Demographics   Is this a new or existing patient? Existing   Patient is considered a/an Refugee   Race Asian     Patient Assistance   Location of Patient Assistance Not Applicable   Patient's financial/insurance status Orange Card/Care Connects   Uninsured Patient Yes   Interventions Assisted patient in making appt.   Patient referred to apply for the following financial assistance Medicaid   Food insecurities addressed Not Applicable   Transportation assistance Yes   Type of Assistance Taxi Voucher Given   Assistance securing medications No   Educational health offerings Navigating the healthcare system     Encounter Details   Primary purpose of visit Acute Illness/Condition Visit   Was an Emergency Department visit averted? Not Applicable   Does patient have a medical provider? Yes   Patient referred to Follow up with established PCP   Was a mental health screening completed? (GAINS tool) No   Does patient have dental issues? No   Does patient have vision issues? Yes   Was a vision referral made? No   Does your patient have an abnormal blood pressure today? No   Since previous encounter, have you referred patient for abnormal blood pressure that resulted in a new diagnosis or medication change? No   Does your patient have an abnormal blood glucose today? No   Since previous encounter, have you referred patient for abnormal blood glucose that resulted in a new diagnosis or medication change? No   Was there a life-saving intervention made? No     Telephone coordination of transportation to appointment today [facility] for [facility] Scan at 11:15 appointment. Transported via taxi to location. Spouse called later  to report scan not done due to malfunctioning equipment at agency.  Appointment to be rescheduled  third time. [facility] scan office contacted re cancellation and will call patient on Tuesday when equipment  available. Client continues to experience severe left shoulder pain per spouse. Arabic speaking family, but spouse able to communicate in short sentences. Refer back to primary provider and assist in rescheduling CT Scan ASAP. Follow-up at [employer] prn.  Ferol LuzMarietta Yechiel Erny, RN/CN.

## 2016-09-08 DIAGNOSIS — R1012 Left upper quadrant pain: Secondary | ICD-10-CM

## 2016-09-08 NOTE — Congregational Nurse Program (Signed)
Congregational Nurse Program Note  Date of Encounter: 09/08/2016  Past Medical History: No past medical history on file.  Encounter Details:     CNP Questionnaire - 09/08/16 1215      Patient Demographics   Is this a new or existing patient? Existing   Patient is considered a/an Refugee   Race Asian     Patient Assistance   Location of Patient Assistance Not Applicable   Patient's financial/insurance status Orange Card/Care Connects   Uninsured Patient Yes   Interventions Assisted patient in making appt.   Patient referred to apply for the following financial assistance Medicaid   Food insecurities addressed Not Applicable   Transportation assistance Yes   Type of Assistance Taxi Voucher Given   Assistance securing medications No   Educational health offerings Navigating the healthcare system     Encounter Details   Primary purpose of visit Acute Illness/Condition Visit   Was an Emergency Department visit averted? Not Applicable   Does patient have a medical provider? Yes   Patient referred to Follow up with established PCP   Was a mental health screening completed? (GAINS tool) No   Does patient have dental issues? No   Does patient have vision issues? Yes   Was a vision referral made? No   Does your patient have an abnormal blood pressure today? No   Since previous encounter, have you referred patient for abnormal blood pressure that resulted in a new diagnosis or medication change? No   Does your patient have an abnormal blood glucose today? No   Since previous encounter, have you referred patient for abnormal blood glucose that resulted in a new diagnosis or medication change? No   Was there a life-saving intervention made? No     Assisted client and spouse to reschedule appointment at [facility], [address] location. Appointment rescheduled for 09/09/16 at 10:30 AM. Information provided to spouse also and understood in limited English. [place] with  [facility]. Ferol LuzMarietta Ellouise Mcwhirter, RN/CN

## 2016-09-14 DIAGNOSIS — R1012 Left upper quadrant pain: Secondary | ICD-10-CM

## 2016-09-15 NOTE — Congregational Nurse Program (Signed)
Congregational Nurse Program Note  Date of Encounter: 09/14/2016  Past Medical History: No past medical history on file.  Encounter Details:     CNP Questionnaire - 09/14/16 1340      Patient Demographics   Is this a new or existing patient? Existing   Patient is considered a/an Refugee   Race Asian     Patient Assistance   Location of Patient Assistance Not Applicable   Patient's financial/insurance status Orange Card/Care Connects   Uninsured Patient Yes   Interventions Assisted patient in making appt.   Patient referred to apply for the following financial assistance Medicaid   Food insecurities addressed Not Applicable   Transportation assistance Yes   Type of Assistance Taxi Voucher Given   Assistance securing medications No   Educational health offerings Navigating the healthcare system     Encounter Details   Primary purpose of visit Acute Illness/Condition Visit   Was an Emergency Department visit averted? Not Applicable   Does patient have a medical provider? Yes   Patient referred to Follow up with established PCP   Was a mental health screening completed? (GAINS tool) No   Does patient have dental issues? No   Does patient have vision issues? Yes   Was a vision referral made? No   Does your patient have an abnormal blood pressure today? No   Since previous encounter, have you referred patient for abnormal blood pressure that resulted in a new diagnosis or medication change? No   Does your patient have an abnormal blood glucose today? No   Since previous encounter, have you referred patient for abnormal blood glucose that resulted in a new diagnosis or medication change? No   Was there a life-saving intervention made? No     Office visit to request assistance to navigate appointment and follow-up with provider appointment after [facility] Scan at [facility] location on 09/08/16. Follow-up appointment recommended with PCP. CT Scan on CD provided to  client. Continues to experience left shoulder, chest and back pain. Pain less today, 5-6. Appointment scheduled with [facility]/PCP on  Friday 10-15-16 at 1:30 pm. Requested earliier appointment with PCP if cancellations. Spouse expressed much concern about this delay. Requested another provider if possible to evaluate condition earlier. In no acute or visible distress  attending English classes at location today, but this is not an indication in view of client's usual quiet presence. Plan: Return to nurse office if further problems. Consult with PCP for earlier scheduling if possible. Ferol LuzMarietta Trice Aspinall, RN/CN

## 2016-09-22 DIAGNOSIS — R1012 Left upper quadrant pain: Secondary | ICD-10-CM

## 2016-09-22 NOTE — Congregational Nurse Program (Signed)
Congregational Nurse Program Note  Date of Encounter: 09/22/2016  Past Medical History: No past medical history on file.  Encounter Details:     CNP Questionnaire - 09/22/16 0930      Patient Demographics   Is this a new or existing patient? Existing   Patient is considered a/an Refugee   Race Asian     Patient Assistance   Location of Patient Assistance Not Applicable   Patient's financial/insurance status Orange Card/Care Connects   Uninsured Patient Yes   Interventions Assisted patient in making appt.   Patient referred to apply for the following financial assistance Medicaid   Food insecurities addressed Not Applicable   Transportation assistance Yes   Type of Assistance Taxi Voucher Given   Assistance securing medications No   Educational health offerings Navigating the healthcare system     Encounter Details   Primary purpose of visit Acute Illness/Condition Visit   Was an Emergency Department visit averted? Not Applicable   Does patient have a medical provider? Yes   Patient referred to Follow up with established PCP   Was a mental health screening completed? (GAINS tool) No   Does patient have dental issues? No   Does patient have vision issues? Yes   Was a vision referral made? No   Does your patient have an abnormal blood pressure today? No   Since previous encounter, have you referred patient for abnormal blood pressure that resulted in a new diagnosis or medication change? No   Does your patient have an abnormal blood glucose today? No   Since previous encounter, have you referred patient for abnormal blood glucose that resulted in a new diagnosis or medication change? No   Was there a life-saving intervention made? No     Office visit at [facility] for this Arabic speaking lady accompanied by her husband to to interpret. English limited. Requested a blood pressure check. Feeling stressed per spouse, worrying about daughter in another country. Continues to  experience left shoulder and upper back pain. Blood pressure essentially ok. Initially higher after walking up two flights of steps to office. [facility]. Advised to return prn if condition worse. Ferol LuzMarietta Renzo Vincelette, RN/CN

## 2016-10-15 ENCOUNTER — Encounter: Payer: Self-pay | Admitting: Family Medicine

## 2016-10-15 ENCOUNTER — Ambulatory Visit: Payer: No Typology Code available for payment source | Attending: Internal Medicine

## 2016-10-15 ENCOUNTER — Ambulatory Visit (INDEPENDENT_AMBULATORY_CARE_PROVIDER_SITE_OTHER): Payer: No Typology Code available for payment source | Admitting: Family Medicine

## 2016-10-15 VITALS — BP 132/67 | HR 79 | Temp 97.9°F | Resp 18 | Ht <= 58 in | Wt 163.4 lb

## 2016-10-15 DIAGNOSIS — Z131 Encounter for screening for diabetes mellitus: Secondary | ICD-10-CM

## 2016-10-15 LAB — POCT GLYCOSYLATED HEMOGLOBIN (HGB A1C): HEMOGLOBIN A1C: 5.7

## 2016-10-15 MED ORDER — SIMVASTATIN 20 MG PO TABS: 20.0000 mg | ORAL_TABLET | Freq: Every day | ORAL | 1 refills | Status: AC

## 2016-10-15 NOTE — Progress Notes (Signed)
Patient Is here for FU  Patient has not been taking medication for a year.

## 2016-10-19 NOTE — Progress Notes (Signed)
Kaitlyn Commonsin [place], is a 60 y.o. female  HYQ:[id number]:[id number]  [id number]  DOB - 12/23/1955  CC:  Chief Complaint  Patient presents with  . Follow-up       HPI: Kaitlyn Burton is a 60 y.o. female here for routine follow-up    No Known Allergies History reviewed. No pertinent past medical history. Current Outpatient Prescriptions on File Prior to Visit  Medication Sig Dispense Refill  . meloxicam (MOBIC) 7.5 MG tablet Take 1 tablet (7.5 mg total) by mouth daily. (Patient not taking: Reported on 10/15/2016) 30 tablet 1  . methocarbamol (ROBAXIN) 500 MG tablet Take 1 tablet (500 mg total) by mouth 2 (two) times daily. As muscle relaxer (Patient not taking: Reported on 10/15/2016) 30 tablet 0  . TRAMADOL HCL ER PO Take by mouth.     No current facility-administered medications on file prior to visit.    History reviewed. No pertinent family history. Social History   Social History  . Marital status: Married    Spouse name: N/A  . Number of children: N/A  . Years of education: N/A   Occupational History  . Not on file.   Social History Main Topics  . Smoking status: Never Smoker  . Smokeless tobacco: Never Used  . Alcohol use No  . Drug use: No  . Sexual activity: Not on file   Other Topics Concern  . Not on file   Social History Narrative  . No narrative on file    Review of Systems: Constitutional: Negative Skin: Negative HENT: Negative  Eyes: Negative  Neck: Negative Respiratory: Negative Cardiovascular: Negative Gastrointestinal: Negative Genitourinary: Negative  Musculoskeletal: Negative   Neurological: Negative for Hematological: Negative  Psychiatric/Behavioral: Negative    Objective:   Vitals:   10/15/16 1334  BP: 132/67  Pulse: 79  Resp: 18  Temp: 97.9 F (36.6 C)    Physical Exam: Constitutional: Patient appears well-developed and well-nourished. No distress. HENT: Normocephalic, atraumatic, External right and left ear normal. Oropharynx is clear  and moist.  Eyes: Conjunctivae and EOM are normal. PERRLA, no scleral icterus. Neck: Normal ROM. Neck supple. No lymphadenopathy, No thyromegaly. CVS: RRR, S1/S2 +, no murmurs, no gallops, no rubs Pulmonary: Effort and breath sounds normal, no stridor, rhonchi, wheezes, rales.  Abdominal: Soft. Normoactive BS,, no distension, tenderness, rebound or guarding.  Musculoskeletal: Normal range of motion. No edema and no tenderness.  Neuro: Alert.Normal muscle tone coordination. Non-focal Skin: Skin is warm and dry. No rash noted. Not diaphoretic. No erythema. No pallor. Psychiatric: Normal mood and affect. Behavior, judgment, thought content normal.  Lab Results  Component Value Date   WBC 6.8 07/23/2016   HGB 13.1 07/23/2016   HCT 39.6 07/23/2016   MCV 81.1 07/23/2016   PLT 276 07/23/2016   Lab Results  Component Value Date   CREATININE 0.54 07/23/2016   BUN 13 07/23/2016   NA 139 07/23/2016   K 4.2 07/23/2016   CL 107 07/23/2016   CO2 24 07/23/2016    Lab Results  Component Value Date   HGBA1C 5.7 10/15/2016   Lipid Panel     Component Value Date/Time   CHOL 189 07/23/2016 1109   TRIG 203 (H) 07/23/2016 1109   HDL 49 07/23/2016 1109   CHOLHDL 3.9 07/23/2016 1109   VLDL 41 (H) 07/23/2016 1109   LDLCALC 99 07/23/2016 1109        Assessment and plan:   1. Screening for diabetes mellitus  - POCT glycosylated hemoglobin (Hb A1C)  No Follow-up on file.  The patient was given clear instructions to go to ER or return to medical center if symptoms don't improve, worsen or new problems develop. The patient verbalized understanding.    Henrietta HooverLinda C Bryttney Netzer FNP  10/19/2016, 1:04 PM

## 2016-11-19 ENCOUNTER — Ambulatory Visit: Payer: Self-pay | Attending: Internal Medicine

## 2016-11-29 NOTE — Progress Notes (Signed)
Kaitlyn Burton, is a 60 y.o. female  ZOX:[id number]:[id number]  [id number]  DOB - 01/19/1956  CC:  Chief Complaint  Patient presents with  . Follow-up       HPI: Kaitlyn Burton is a 60 y.o. female here for routine follow-up. She reports no significant change since her last vist. At that visit, we failed to screen for diabetes.  Her only current medications are for recurrent neck and shoulder pain.  Her BP today is 132 67.   No Known Allergies History reviewed. No pertinent past medical history. Current Outpatient Prescriptions on File Prior to Visit  Medication Sig Dispense Refill  . meloxicam (MOBIC) 7.5 MG tablet Take 1 tablet (7.5 mg total) by mouth daily. (Patient not taking: Reported on 10/15/2016) 30 tablet 1  . methocarbamol (ROBAXIN) 500 MG tablet Take 1 tablet (500 mg total) by mouth 2 (two) times daily. As muscle relaxer (Patient not taking: Reported on 10/15/2016) 30 tablet 0  . TRAMADOL HCL ER PO Take by mouth.     No current facility-administered medications on file prior to visit.    History reviewed. No pertinent family history. Social History   Social History  . Marital status: Married    Spouse name: N/A  . Number of children: N/A  . Years of education: N/A   Occupational History  . Not on file.   Social History Main Topics  . Smoking status: Never Smoker  . Smokeless tobacco: Never Used  . Alcohol use No  . Drug use: No  . Sexual activity: Not on file   Other Topics Concern  . Not on file   Social History Narrative  . No narrative on file    Review of Systems: Constitutional: Negative Skin: Negative HENT: Negative  Eyes: Negative  Neck: Negative Respiratory: Negative Cardiovascular: Negative Gastrointestinal: Negative Genitourinary: Negative  Musculoskeletal: Negative   Neurological: Negative for Hematological: Negative  Psychiatric/Behavioral: Negative    Objective:   Vitals:   10/15/16 1334  BP: 132/67  Pulse: 79  Resp: 18  Temp: 97.9 F  (36.6 C)    Physical Exam: Constitutional: Patient appears well-developed and well-nourished. No distress. HENT: Normocephalic, atraumatic, External right and left ear normal. Oropharynx is clear and moist.  Eyes: Conjunctivae and EOM are normal. PERRLA, no scleral icterus. Neck: Normal ROM. Neck supple. No lymphadenopathy, No thyromegaly. CVS: RRR, S1/S2 +, no murmurs, no gallops, no rubs Pulmonary: Effort and breath sounds normal, no stridor, rhonchi, wheezes, rales.  Abdominal: Soft. Normoactive BS,, no distension, tenderness, rebound or guarding.  Musculoskeletal: Normal range of motion. No edema and no tenderness.  Neuro: Alert.Normal muscle tone coordination. Non-focal Skin: Skin is warm and dry. No rash noted. Not diaphoretic. No erythema. No pallor. Psychiatric: Normal mood and affect. Behavior, judgment, thought content normal.  Lab Results  Component Value Date   WBC 6.8 07/23/2016   HGB 13.1 07/23/2016   HCT 39.6 07/23/2016   MCV 81.1 07/23/2016   PLT 276 07/23/2016   Lab Results  Component Value Date   CREATININE 0.54 07/23/2016   BUN 13 07/23/2016   NA 139 07/23/2016   K 4.2 07/23/2016   CL 107 07/23/2016   CO2 24 07/23/2016    Lab Results  Component Value Date   HGBA1C 5.7 10/15/2016   Lipid Panel     Component Value Date/Time   CHOL 189 07/23/2016 1109   TRIG 203 (H) 07/23/2016 1109   HDL 49 07/23/2016 1109   CHOLHDL 3.9 07/23/2016 1109   VLDL  41 (H) 07/23/2016 1109   LDLCALC 99 07/23/2016 1109        Assessment and plan:   1. Screening for diabetes mellitus  - POCT glycosylated hemoglobin (Hb A1C)   PRN follow-up  The patient was given clear instructions to go to ER or return to medical center if symptoms don't improve, worsen or new problems develop. The patient verbalized understanding.    Henrietta HooverLinda C Bernhardt FNP  11/29/2016, 11:45 AM

## 2017-01-25 ENCOUNTER — Ambulatory Visit: Payer: Self-pay | Admitting: Family Medicine

## 2017-03-16 NOTE — Congregational Nurse Program (Signed)
Congregational Nurse Program Note  Date of Encounter: 01/11/2017  Past Medical History: No past medical history on file.  Encounter Details:  Office visit at [employer] by this [place] lady speaking Arabic. Relayed concerns via interpreter about insurance coverage and ability to pay.  Ineligible for Medicaid. Incurred high medical bills for last diagnostic tests not covered by limited health. Continues to experience left shoulder and back pain. Referred to agency social worker, C. Sin for assistance, renew [employer] and [employer]. Follow-up at [facility] for pain . Ferol Luz, RN/CN

## 2017-03-21 IMAGING — DX DG LUMBAR SPINE COMPLETE 4+V
5 series · 5 of 5 positions shown · non-contrast
Comparison: None.

CLINICAL DATA: Right lumbar pain

EXAM:
LUMBAR SPINE - COMPLETE 4+ VIEW

[l-spine ap]
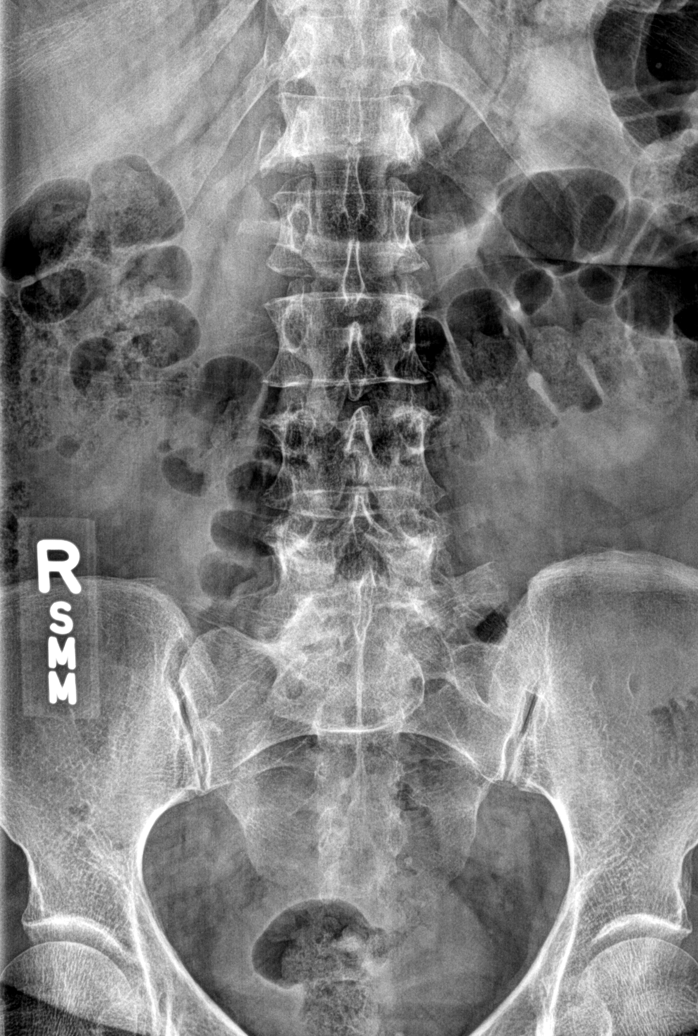

[l-spine obl (1 of 2)]
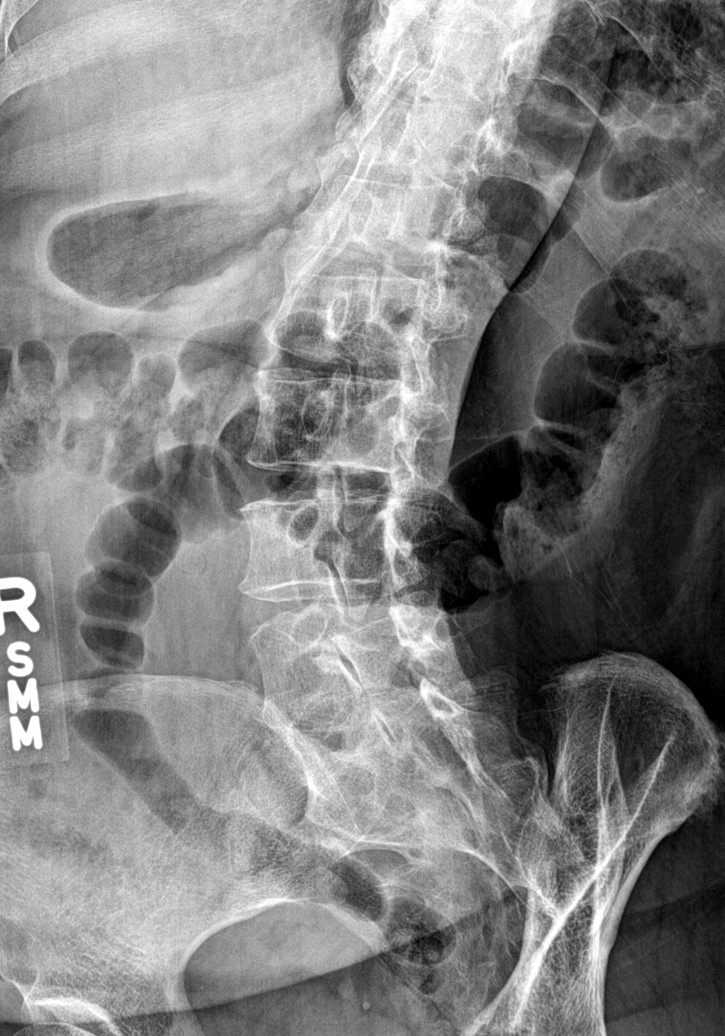

[l-spine obl (2 of 2)]
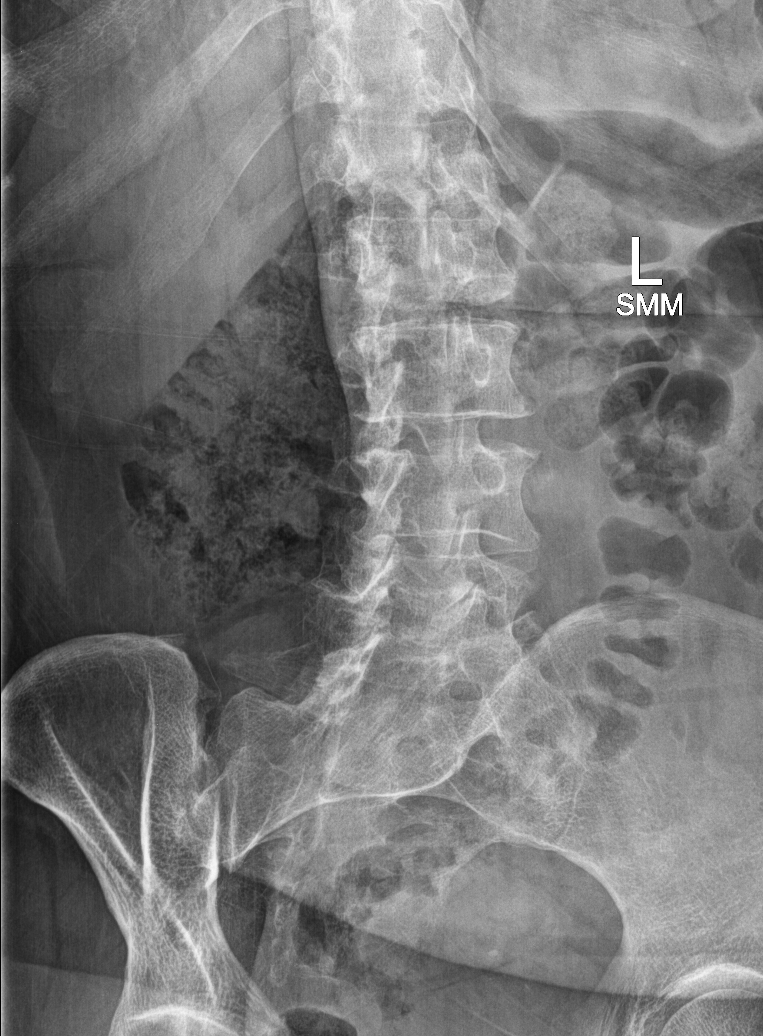

[l-spine lat]
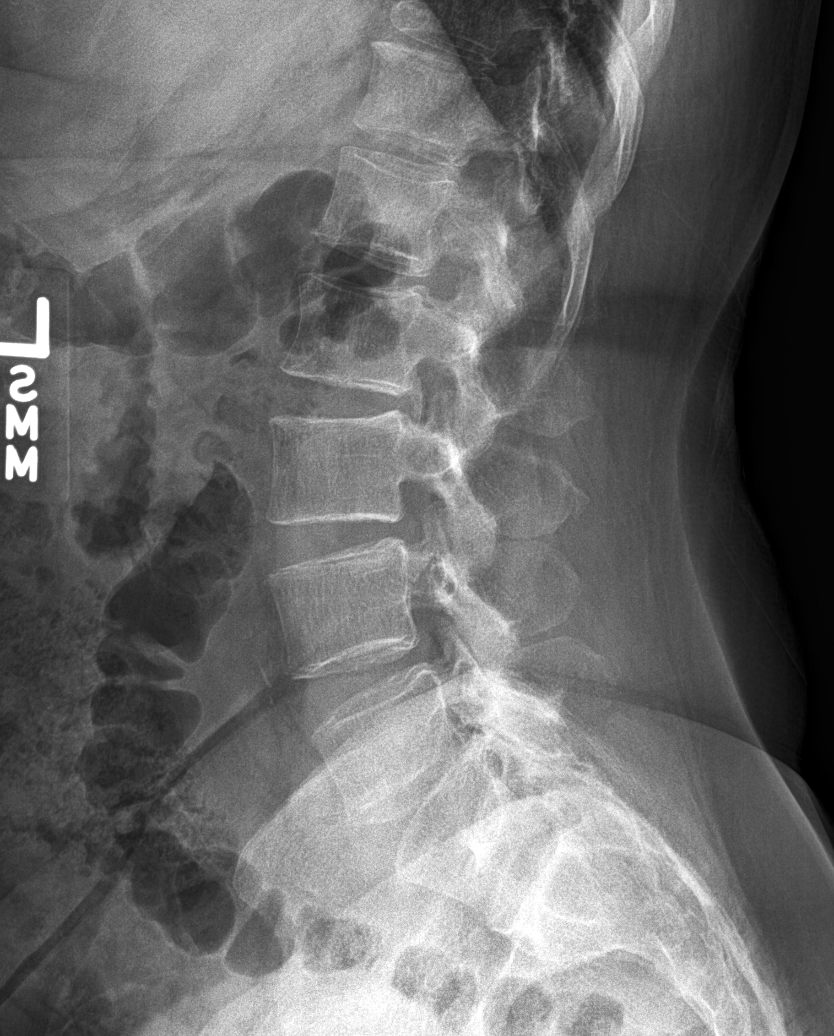

[l-spine spot]
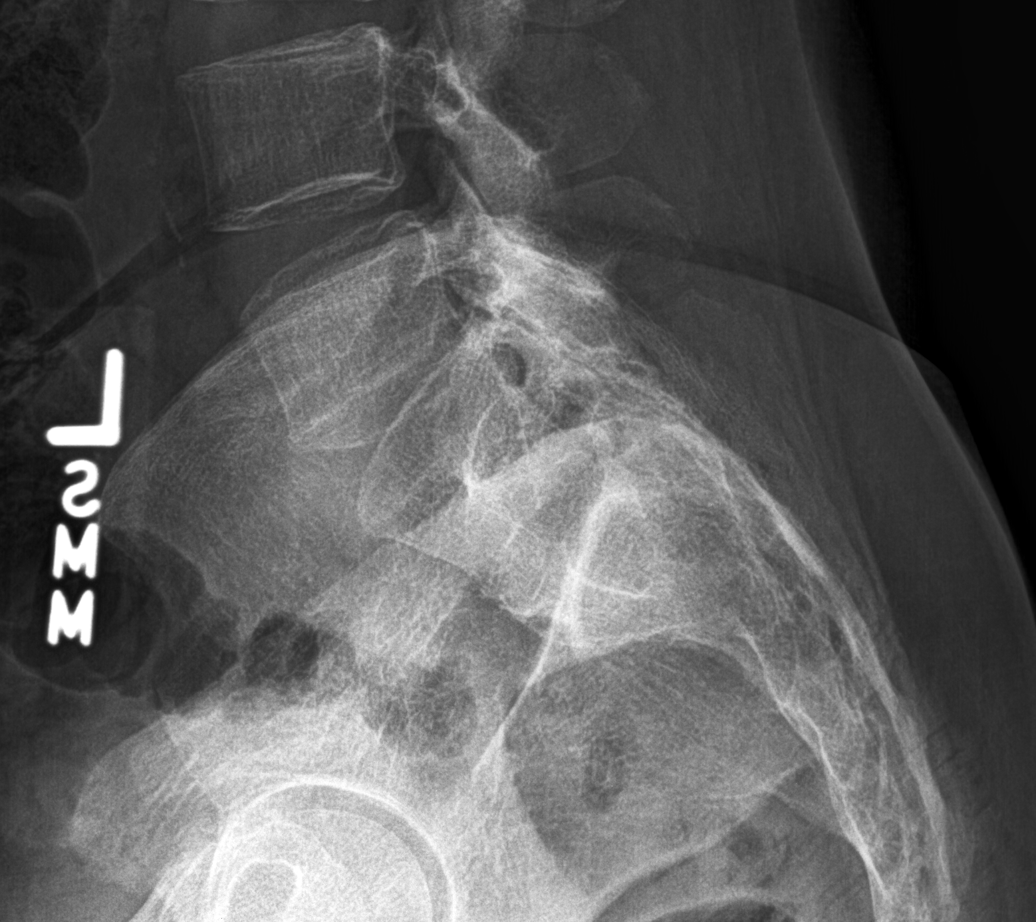

[5 of 5 positions shown; findings below may reference images not displayed]

FINDINGS: There is no evidence of lumbar spine fracture. Alignment is normal.
Intervertebral disc spaces are maintained.
IMPRESSION: Negative.

## 2018-02-26 IMAGING — DX DG CHEST 2V
2 series · 2 of 2 positions shown · non-contrast
Comparison: None.

CLINICAL DATA: 60-year-old female with a history of left shoulder
pain

EXAM:
CHEST  2 VIEW

[chest pa]
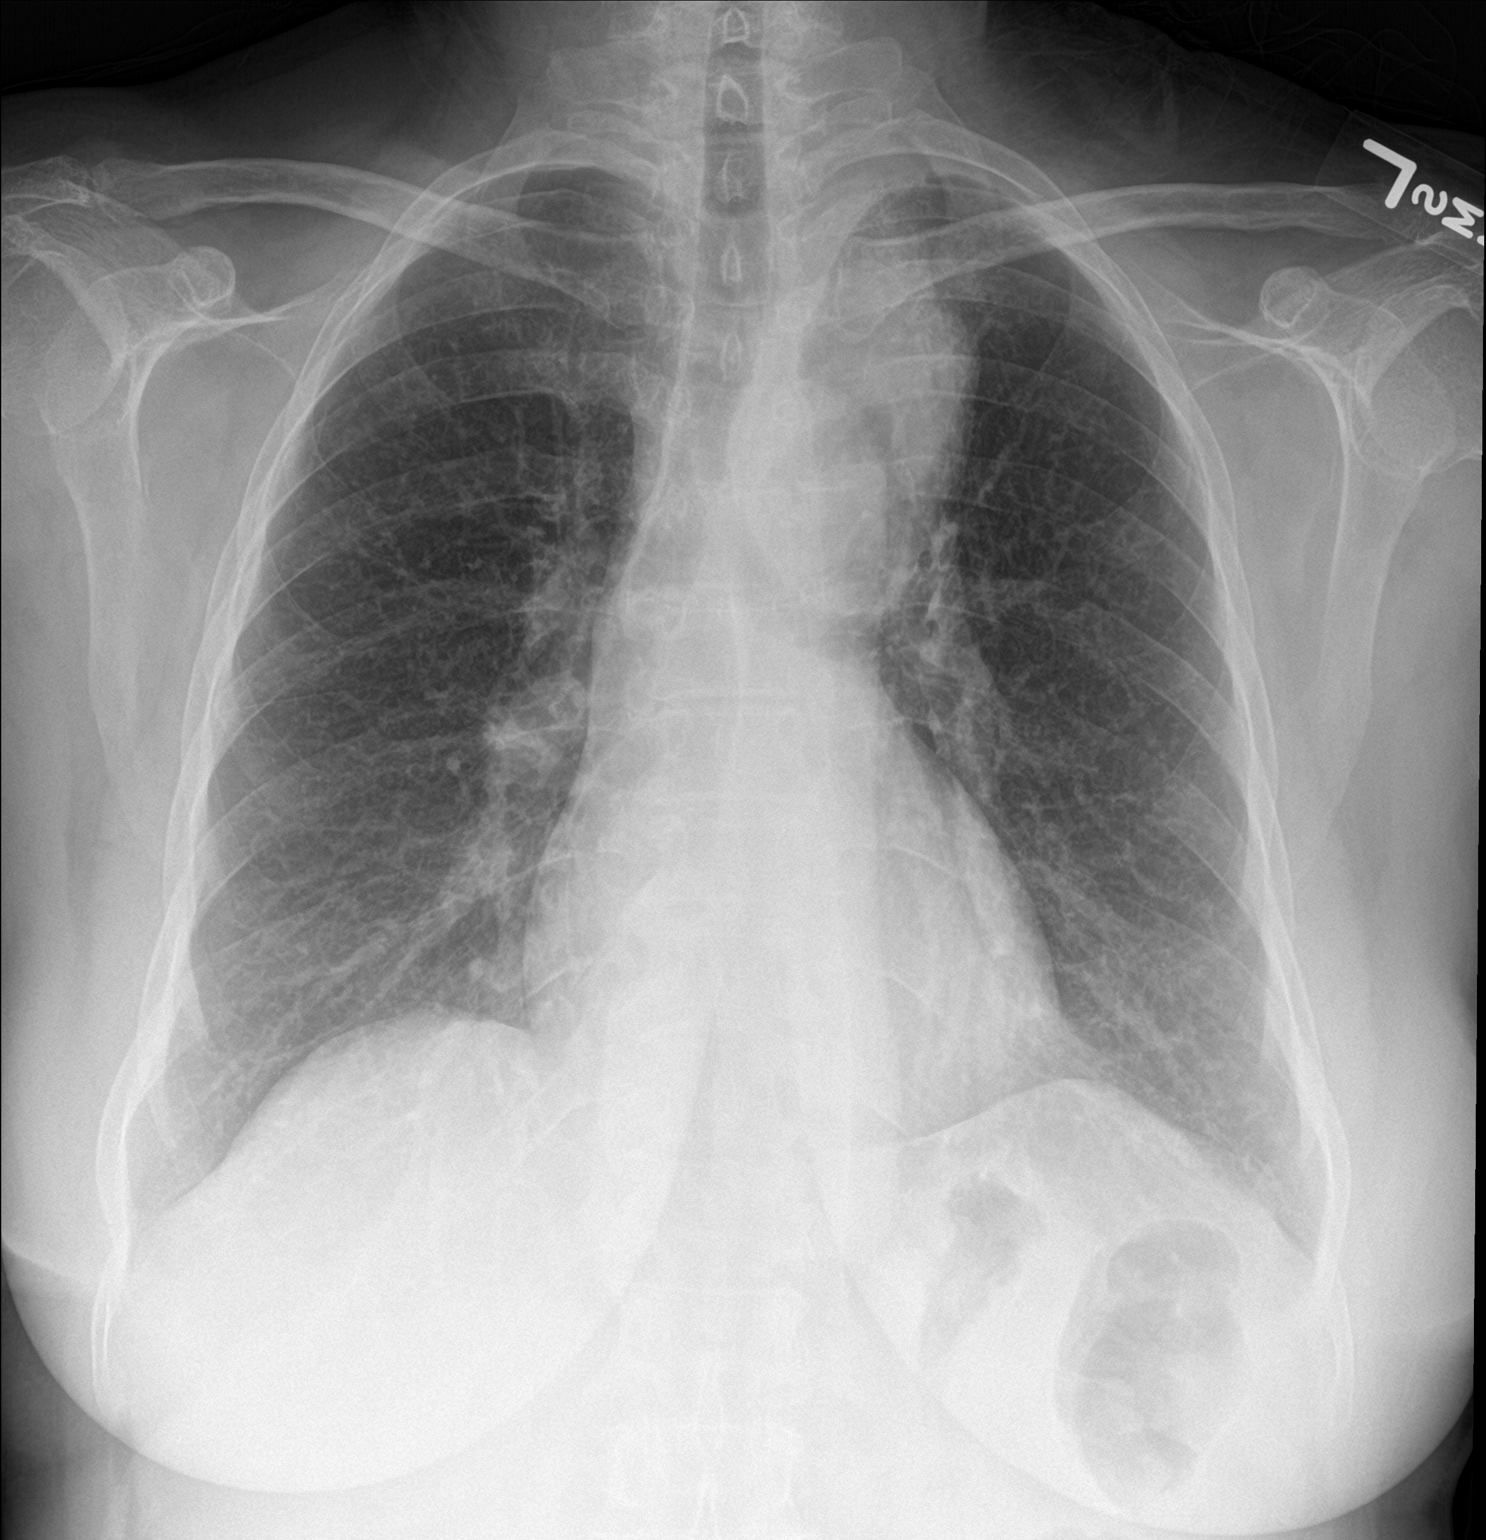

[chest lat]
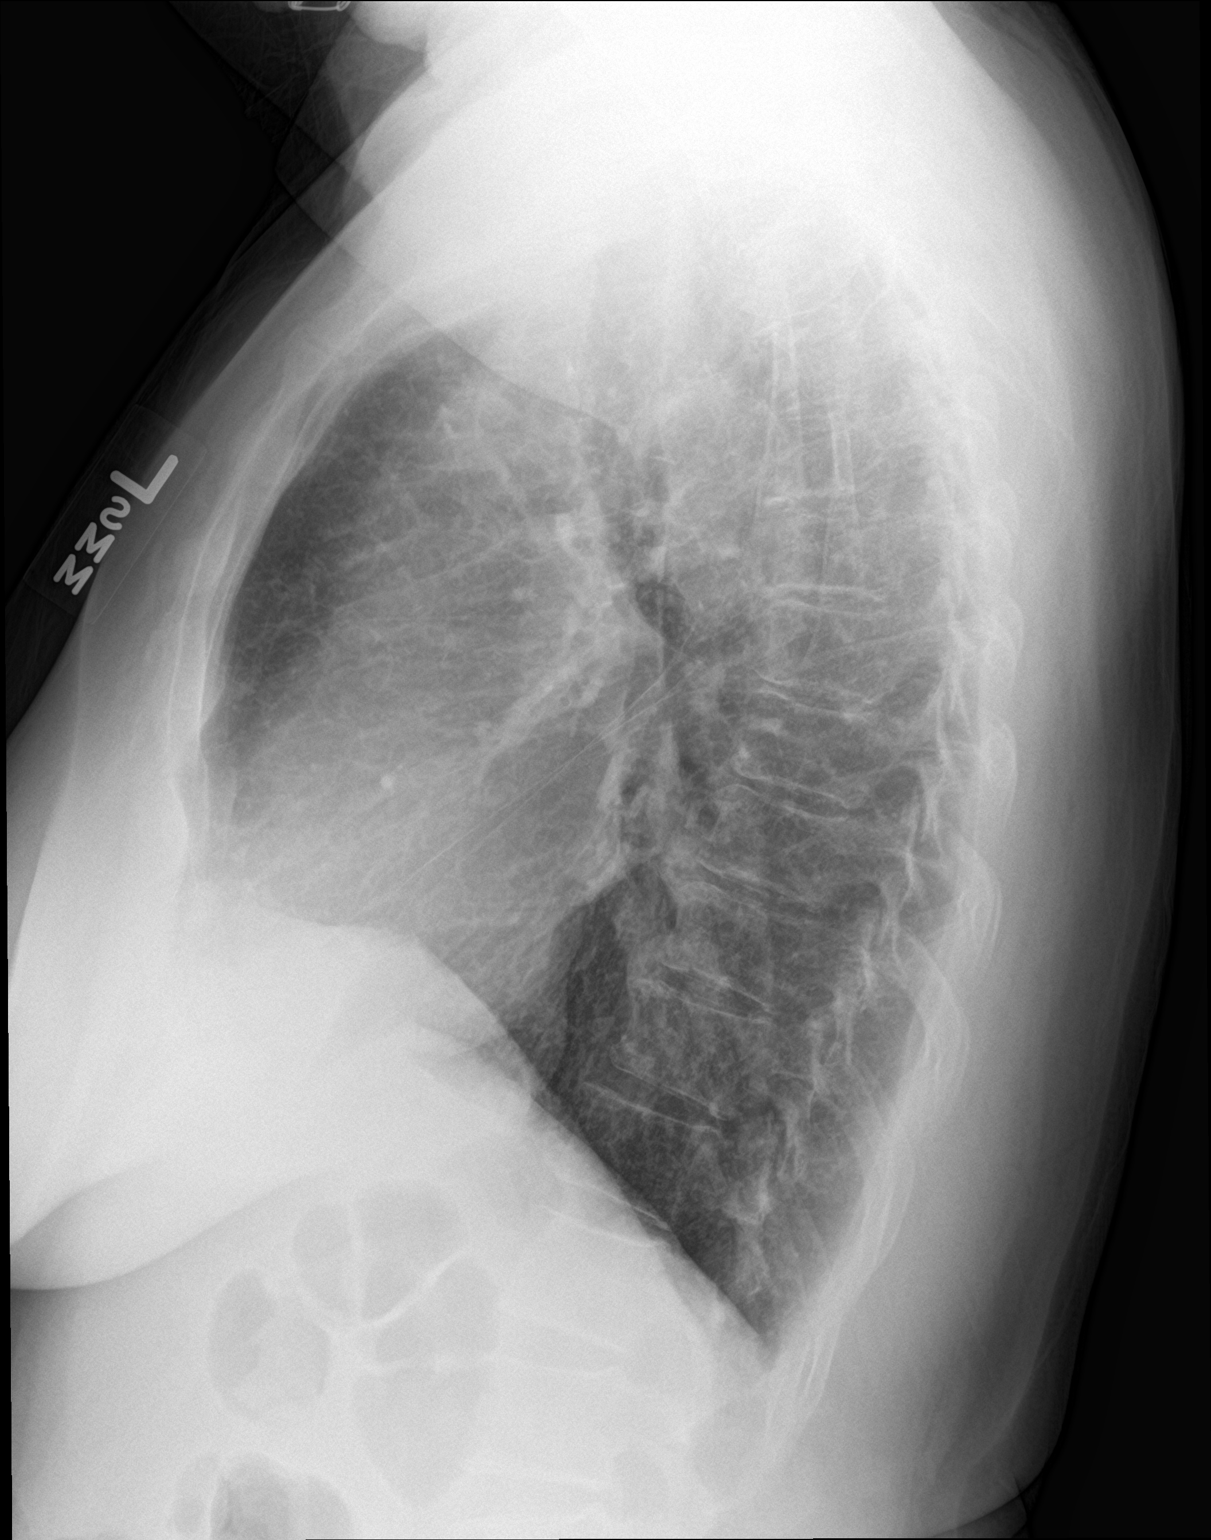

[2 of 2 positions shown; findings below may reference images not displayed]

FINDINGS: Cardiomediastinal silhouette within normal limits.

Soft tissue density projecting superior to the aortic arch, along
the medial pleural parenchymal interface at the mediastinal border.
Pleural parenchymal thickening at the apex of the left lung.

No confluent airspace disease.  No pneumothorax.

Stigmata of emphysema, with increased retrosternal airspace,
flattened hemidiaphragms, increased AP diameter, and hyperinflation
on the AP view.
IMPRESSION: Soft tissue opacity along the medial left upper lung, likely
posterior on the lateral view. Further evaluation with
contrast-enhanced chest CT recommended to evaluate for malignancy.

These results will be called to the ordering clinician or
representative by the Radiologist Assistant, and communication
documented in the PACS or zVision Dashboard.
